# Patient Record
Sex: Male | Born: 1995 | Hispanic: No | Marital: Single | Smoking: Current every day smoker
Health system: Southern US, Academic
[De-identification: ages and names within clinical notes are randomized; demographics above are authoritative.]

## PROBLEM LIST (undated history)

## (undated) NOTE — ED Notes (Signed)
Formatting of this note might be different from the original.  Pt calm , collective , denied sob and ambulatory upon discharge. Discharged w/ support person   Electronically signed by Bennie Pierini, RN BSN at 08/27/2018 10:45 PM EDT

## (undated) NOTE — ED Notes (Signed)
Formatting of this note might be different from the original.  Pt given discharge paperwork, allowing time for questions. Pt verbalized understanding, pt ambulatory and in NAD at time of discharge.    Electronically signed by Cathleen Fears, RN at 09/17/2021  6:33 PM EDT

## (undated) NOTE — ED Provider Notes (Signed)
Formatting of this note is different from the original.  Images from the original note were not included.     [facility]     Chief Complaint   Pharyngitis (Pt reports sore throat with neck swelling and episodes of nausea/vomiting that started last night)      History of Present Illness   Terry Nicholson is a 70 y.o. male who presents to the ED with a chief complaint of sore throat.  Reports he is had this since last night.  Also had episodes of nausea and vomiting due to the sore throat.  States he is unable to tolerate fluids at this time.  Reports no fever, trismus, does have some odynophagia.  Reports no recent exposures.     Medical History   History reviewed. No pertinent past medical history.  History reviewed. No pertinent surgical history.  No family history on file.  Social History     Social History Narrative    Not on file     Social History     Tobacco Use    Smoking status: Not on file    Smokeless tobacco: Not on file   Substance Use Topics    Alcohol use: Not on file    Drug use: Not on file       Review of Systems   Review of Systems   Constitutional:  Negative for fever.   HENT:  Positive for sore throat.    Respiratory:  Negative for cough.    Gastrointestinal:  Positive for nausea and vomiting.   Musculoskeletal:  Negative for neck pain.      Physical Exam   Vitals:    09/17/21 1612 09/17/21 1616   BP: (!) 174/91    Patient Position: Sitting    Pulse: 86    Resp: 14    Temp: 36.3 C (97.4 F)    TempSrc: Temporal    SpO2: 95%    Weight:  (!) 103.8 kg (228 lb 13.4 oz)   Height: 1.829 m (6')      Physical Exam  Vitals and nursing note reviewed.   Constitutional:       Appearance: Normal appearance.   HENT:      Right Ear: Tympanic membrane normal.      Left Ear: Tympanic membrane normal.      Mouth/Throat:      Pharynx: Posterior oropharyngeal erythema present. No oropharyngeal exudate.   Cardiovascular:      Rate and Rhythm: Normal rate.   Pulmonary:      Effort:  Pulmonary effort is normal.   Musculoskeletal:      Cervical back: Tenderness present.   Lymphadenopathy:      Cervical: No cervical adenopathy.   Neurological:      Mental Status: He is alert.       Procedures   Procedures  No notes on file      Medical Decision Making   Results for orders placed or performed during the hospital encounter of 09/17/21   Streptococcus A Screen    Specimen: Throat; Swab   Result Value Ref Range    Streptococcus A Screen Negative Negative   Mononucleosis Screen    Specimen: Blood   Result Value Ref Range    Mono Screen Negative Negative   COVID19, Flu A+B and RSV    Specimen: Nasopharyngeal; Swab   Result Value Ref Range    Influenza A Negative Negative, Invalid    Influenza B Negative Negative,  Invalid    RSV PCR Negative Negative, Invalid    COVID-19, PCR Not Detected Not Detected, Invalid     No orders to display     MEDICATIONS RECEIVED IN ED:  Medications   ondansetron (ZOFRAN-ODT) disintegrating tablet 4 mg (4 mg Oral Given 09/17/21 1700)   ketorolac (TORADOL) injection 15 mg (15 mg Intramuscular Given 09/17/21 1712)     MDM:  9 year old male with sore throat for 1 day with some nausea vomiting.  Does have some tonsillar erythema but no exudate.  Does have cervical tenderness but no identifiable lymphadenopathy.  Is having nausea vomiting, patient states this is due to him having trouble swallowing.  We will check a viral panel including COVID and flu, we will check a strep and mono, given medications for nausea and do a p.o. fluid challenge.    P.o. fluid challenge was successful.  We will give a course of Zofran to use as needed for any nausea.  Point of care testing was negative.  Discussed with patient this is likely viral, can use over-the-counter medications for symptomatic relief.  Instructed any new or worsening symptoms can return to the ER.  Medical Decision Making      ED Course and Updates       Disposition and Diagnosis   Disposition  Discharged [1]    Diagnosis  1.  Acute pharyngitis, unspecified etiology        Medications  New Prescriptions    ONDANSETRON (ZOFRAN) 4 MG TABLET    Take 1 tablet by mouth every 8 hours as needed for nausea.       Follow Up  No follow-up provider specified.       Attestation   Electronically signed by Cynda Familia, NP.       Cynda Familia, NP  09/17/21 1822    Electronically signed by Fredrich Birks, MD at 09/19/2021 11:58 AM EDT

## (undated) NOTE — ED Provider Notes (Signed)
Formatting of this note is different from the original.  [facility]    ED Provider Note    Terry Nicholson 67 y.o. male DOB: 18-Jul-1995 MRN: [id number]  History     Chief Complaint   Patient presents with   ? Sore Throat     for 2 days, pt states his throat feels swollen. tested neg for Strep at [facility] today, Covid-19 test pending     History provided by:  Patient  Sore Throat   Location:  Posterior  Quality:  Sore  Severity:  Moderate  Onset quality:  Sudden  Duration:  2 days  Timing:  Constant  Progression:  Unchanged  Chronicity:  New  Relieved by:  Nothing  Worsened by:  Swallowing  Associated symptoms: no adenopathy, no chills, no cough, no ear pain, no fever, no rash, no rhinorrhea, no stridor and no voice change    Risk factors: no exposure to mono and no sick contacts      No past medical history on file.    No past surgical history on file.    Social History     Substance and Sexual Activity   Alcohol Use Not on file     Social History     Tobacco Use   Smoking Status Not on file     No existing history information found.  Social History     Substance and Sexual Activity   Drug Use Not on file           No Known Allergies    Home Medications    No medications on file     Review of Systems     Review of Systems   Constitutional: Negative for chills and fever.   HENT: Positive for sore throat. Negative for ear pain, rhinorrhea and voice change.    Eyes: Negative for redness.   Respiratory: Negative for cough and stridor.    Gastrointestinal: Negative for vomiting.   Skin: Negative for rash.   Hematological: Negative for adenopathy.     Physical Exam     ED Triage Vitals [08/27/18 2116]   BP (!) 153/83   Heart Rate 109   Resp 16   SpO2 98 %   Temp 100.1 F (37.8 C)     Physical Exam   Nursing note and vitals reviewed.  Constitutional: He appears well-developed and well-nourished.   HENT:   Mouth/Throat: Oropharyngeal exudate and posterior oropharyngeal erythema. No  peritonsillar abscesses.No uvular swelling. No muffled voice. No foreign body present.   Neck: No stridor.   Cardiovascular: Regular rhythm and normal heart sounds. Tachycardia present.   Pulmonary/Chest: Respiratory effort normal and breath sounds normal.   Lymphadenopathy:     No cervical adenopathy.   Neurological: He is alert and oriented to person, place, and time.   Skin: Skin is warm. Skin is dry.     ED Course     Lab results:    No data to display    Imaging:  No data to display  ECG:  ECG Results    None      Pre-Sedation  Procedures      MDM  Number of Diagnoses or Management Options  Patient Progress  Patient progress: stable    Coding    Provider Communication    New Prescriptions    AMOXICILLIN (AMOXIL) 500 MG CAPSULE    Take one capsule (500 mg dose) by mouth 3 (three) times a day  for 7 days.       Quantity: 21 capsule    Refills: 0     Modified Medications    No medications on file     Discontinued Medications    No medications on file     Clinical Impression     Final diagnoses:   Pharyngitis, unspecified etiology     ED Disposition     ED Disposition Comment    Discharge                Follow-up Information     [facility] Emergency Department.    Specialty:  Emergency Medicine  Comments:  As needed  Contact information:  [place]  [phone]            Electronically signed by:    Era Bumpers, MD  08/27/18 2150    Electronically signed by Era Bumpers, MD at 08/27/2018  9:50 PM EDT

## (undated) NOTE — ED Notes (Signed)
Formatting of this note might be different from the original.  Pt masked at 2056  Electronically signed by Marylene Land, RN at 08/27/2018  9:18 PM EDT

---
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
## 2016-08-22 ENCOUNTER — Emergency Department (HOSPITAL_COMMUNITY): Payer: Self-pay

## 2016-08-22 ENCOUNTER — Emergency Department (HOSPITAL_COMMUNITY)
Admission: EM | Admit: 2016-08-22 | Discharge: 2016-08-22 | Disposition: A | Discharge status: Home / Self Care | Payer: Self-pay | Attending: Emergency Medicine | Admitting: Emergency Medicine

## 2016-08-22 ENCOUNTER — Encounter (HOSPITAL_COMMUNITY): Payer: Self-pay

## 2016-08-22 DIAGNOSIS — S93402A Sprain of unspecified ligament of left ankle, initial encounter: Secondary | ICD-10-CM | POA: Insufficient documentation

## 2016-08-22 DIAGNOSIS — Y999 Unspecified external cause status: Secondary | ICD-10-CM | POA: Insufficient documentation

## 2016-08-22 DIAGNOSIS — Y939 Activity, unspecified: Secondary | ICD-10-CM | POA: Insufficient documentation

## 2016-08-22 DIAGNOSIS — S99912A Unspecified injury of left ankle, initial encounter: Secondary | ICD-10-CM

## 2016-08-22 DIAGNOSIS — M79672 Pain in left foot: Secondary | ICD-10-CM

## 2016-08-22 DIAGNOSIS — S93602A Unspecified sprain of left foot, initial encounter: Secondary | ICD-10-CM | POA: Insufficient documentation

## 2016-08-22 DIAGNOSIS — F17210 Nicotine dependence, cigarettes, uncomplicated: Secondary | ICD-10-CM | POA: Insufficient documentation

## 2016-08-22 DIAGNOSIS — Y929 Unspecified place or not applicable: Secondary | ICD-10-CM | POA: Insufficient documentation

## 2016-08-22 MED ORDER — HYDROCODONE-ACETAMINOPHEN 5-325 MG PO TABS
1.0000 | ORAL_TABLET | Freq: Once | ORAL | Status: AC
Start: 2016-08-22 — End: 2016-08-22
  Administered 2016-08-22: 1 via ORAL
  Filled 2016-08-22: qty 1

## 2016-08-22 NOTE — Discharge Instructions (Signed)
Wear ankle brace for at least 2 weeks for stabilization of ankle. Use crutches as needed for comfort. Ice and elevate ankle/foot throughout the day, using ice pack for no more than 20 minutes every hour.  Alternate between tylenol and motrin as needed for pain. Call podiatrist follow up today or tomorrow to schedule followup appointment for recheck of ongoing ankle/foot pain in 1 week. Return to the ER for changes or worsening symptoms.

## 2016-08-22 NOTE — ED Triage Notes (Addendum)
Pt c/o L ankle pain x 1 day.  Pain score 7/10.  Pt has not taken anything for pain.  Swelling noted. Pt reports he injured it while long boarding.

## 2016-08-22 NOTE — ED Provider Notes (Signed)
[facility]-EMERGENCY DEPT Provider Note   CSN: [id number] Arrival date & time: 08/22/16  1113  By signing my name below, I, Modena Jansky, attest that this documentation has been prepared under the direction and in the presence of non-physician practitioner, [address], PA-C. Electronically Signed: Modena Jansky, Scribe. 08/22/2016. 1:37 PM.  History   Chief Complaint Chief Complaint  Patient presents with  . Ankle Injury   The history is provided by the patient and medical records. No language interpreter was used.  Ankle Injury  This is a new problem. The current episode started yesterday. The problem occurs constantly. The problem has been gradually worsening. The symptoms are aggravated by walking, standing and bending. Nothing relieves the symptoms. He has tried nothing for the symptoms. The treatment provided no relief.    HPI Comments: [name] [name] is a 21 y.o. male who presents to the Emergency Department complaining of constant moderate left ankle/foot pain that started yesterday. He states he was riding his long board yesterday without a helmet when he fell on his left foot/ankle. No head injury or LOC. His pain worsened this morning so he came to the ED. He describes the pain as 7/10 constant, sharp/throbbing, L ankle pain radiating to left foot, exacerbated by weight-bearing, movement, and palpation, and with no treatments tried PTA. He reports associated swelling, bruising, and tingling in the toes. Denies numbness, focal weakness, abrasions, head inj, LOC, or any other complaints or injuries at this time. Not on blood thinners.    History reviewed. No pertinent past medical history.  There are no active problems to display for this patient.   History reviewed. No pertinent surgical history.     Home Medications    Prior to Admission medications   Not on File    Family History History reviewed. No pertinent family history.  Social History Social History    Substance Use Topics  . Smoking status: Current Some Day Smoker    Packs/day: 1.00    Types: Cigarettes  . Smokeless tobacco: Never Used  . Alcohol use 0.6 oz/week    1 Cans of beer per week     Comment: socially      Allergies   Patient has no known allergies.   Review of Systems Review of Systems  HENT: Negative for facial swelling (no head inj).   Musculoskeletal: Positive for arthralgias, joint swelling and myalgias.  Skin: Positive for color change. Negative for wound.  Allergic/Immunologic: Negative for immunocompromised state.  Neurological: Negative for syncope, weakness and numbness.       +tingling  Hematological: Does not bruise/bleed easily.  Psychiatric/Behavioral: Negative for confusion.    Physical Exam Updated Vital Signs BP (!) 141/72 (BP Location: Left Arm)   Pulse (!) 112   Temp 98.3 F (36.8 C) (Oral)   Resp 18   Ht 6\' 1"  (1.854 m)   Wt 30 lb (13.6 kg)   SpO2 100%   BMI 3.96 kg/m   Physical Exam  Constitutional: He is oriented to person, place, and time. Vital signs are normal. He appears well-developed and well-nourished.  Non-toxic appearance. No distress.  Afebrile, nontoxic, NAD  HENT:  Head: Normocephalic and atraumatic.  Mouth/Throat: Mucous membranes are normal.  Eyes: Conjunctivae and EOM are normal. Right eye exhibits no discharge. Left eye exhibits no discharge.  Neck: Normal range of motion. Neck supple.  Cardiovascular: Normal rate and intact distal pulses.   Tachycardic in triage which resolved on exam  Pulmonary/Chest: Effort normal. No respiratory distress.  Abdominal: Normal appearance. He exhibits no distension.  Musculoskeletal:       Left ankle: He exhibits decreased range of motion (due to pain), swelling and ecchymosis. He exhibits no deformity, no laceration and normal pulse. Tenderness. Lateral malleolus and medial malleolus tenderness found. Achilles tendon normal.  Left ankle with limited ROM due to pain, +swelling,  no crepitus or deformity, with moderate TTP of the lateral and medial malleoli extending into the mid foot, but no TTP or swelling of calf. No break in skin. +bruising without erythema. No warmth. Achilles intact. Good pedal pulse and cap refill of all toes. Wiggling toes without difficulty. Sensation grossly intact. Soft compartments  Neurological: He is alert and oriented to person, place, and time. He has normal strength. No sensory deficit.  Skin: Skin is warm, dry and intact. No rash noted.  Psychiatric: He has a normal mood and affect.  Nursing note and vitals reviewed.    ED Treatments / Results  DIAGNOSTIC STUDIES: Oxygen Saturation is 100% on RA, normal by my interpretation.    COORDINATION OF CARE: 1:41 PM- Pt advised of plan for treatment and pt agrees.  Labs (all labs ordered are listed, but only abnormal results are displayed) Labs Reviewed - No data to display  EKG  EKG Interpretation None       Radiology Dg Ankle Complete Left  Result Date: 08/22/2016 CLINICAL DATA:  Ankle injury.  Fall.  Pain. EXAM: LEFT ANKLE COMPLETE - 3+ VIEW COMPARISON:  None. FINDINGS: Lateral soft tissue swelling. Well corticated bone fragments adjacent to the medial and lateral malleoli, likely related to old injury. No visible acute fracture. Joint spaces are maintained. IMPRESSION: Well corticated bone fragments adjacent to both malleoli, likely related to old injury. No visible acute fracture. Electronically Signed   By: Charlett NoseKevin  Dover M.D.   On: 08/22/2016 14:01   Dg Foot Complete Left  Result Date: 08/22/2016 CLINICAL DATA:  Larey SeatFell from a long board 1 day ago, generalized LEFT foot pain proximally and in ankle EXAM: LEFT FOOT - COMPLETE 3+ VIEW COMPARISON:  None FINDINGS: Osseous mineralization normal. Joint spaces preserved. No acute fracture, dislocation, or bone destruction. Mild anterior soft tissue swelling at ankle. IMPRESSION: No acute osseous abnormalities. Electronically Signed   By:  Ulyses SouthwardMark  Boles M.D.   On: 08/22/2016 15:30    Procedures Procedures (including critical care time)  Medications Ordered in ED Medications  HYDROcodone-acetaminophen (NORCO/VICODIN) 5-325 MG per tablet 1 tablet (1 tablet Oral Given 08/22/16 1401)     Initial Impression / Assessment and Plan / ED Course  I have reviewed the triage vital signs and the nursing notes.  Pertinent labs & imaging results that were available during my care of the patient were reviewed by me and considered in my medical decision making (see chart for details).     21 y.o. male here with L ankle/foot injury while skateboarding yesterday. No head inj or LOC. On exam, achilles intact, mild swelling and bruising to ankle, moderate TTP to bimalleoli and into the mid-foot. NVI with soft compartments. Will give pain meds and obtain xray imaging of foot and ankle. Will reassess shortly  3:37 PM Xrays negative for acute fracture, demonstrate prior fx but doesn't seem like this would be from yesterday's injury given how well rounded and corticated the fragments are. Advised RICE, tylenol/motrin for pain, will apply ASO brace and give crutches for comfort, discussed f/up with podiatry in 1wk for recheck of symptoms and ongoing management of injury. Advised always  wearing a helmet. I explained the diagnosis and have given explicit precautions to return to the ER including for any other new or worsening symptoms. The patient understands and accepts the medical plan as it's been dictated and I have answered their questions. Discharge instructions concerning home care and prescriptions have been given. The patient is STABLE and is discharged to home in good condition.   I personally performed the services described in this documentation, which was scribed in my presence. The recorded information has been reviewed and is accurate.   Final Clinical Impressions(s) / ED Diagnoses   Final diagnoses:  Sprain of left ankle, unspecified  ligament, initial encounter  Injury of left ankle, initial encounter  Left foot pain  Foot sprain, left, initial encounter    New Prescriptions New Prescriptions   No medications on [address], [place], [place] 08/22/16 1537    Jacalyn Lefevre, MD 08/22/16 [phone]

## 2017-07-20 ENCOUNTER — Emergency Department (HOSPITAL_COMMUNITY): Payer: Self-pay

## 2017-07-20 ENCOUNTER — Encounter (HOSPITAL_COMMUNITY): Payer: Self-pay | Admitting: Emergency Medicine

## 2017-07-20 ENCOUNTER — Emergency Department (HOSPITAL_COMMUNITY)
Admission: EM | Admit: 2017-07-20 | Discharge: 2017-07-20 | Disposition: A | Discharge status: Home / Self Care | Payer: Self-pay | Attending: Emergency Medicine | Admitting: Emergency Medicine

## 2017-07-20 DIAGNOSIS — Y999 Unspecified external cause status: Secondary | ICD-10-CM | POA: Insufficient documentation

## 2017-07-20 DIAGNOSIS — S40011A Contusion of right shoulder, initial encounter: Secondary | ICD-10-CM | POA: Insufficient documentation

## 2017-07-20 DIAGNOSIS — F17210 Nicotine dependence, cigarettes, uncomplicated: Secondary | ICD-10-CM | POA: Insufficient documentation

## 2017-07-20 DIAGNOSIS — Y929 Unspecified place or not applicable: Secondary | ICD-10-CM | POA: Insufficient documentation

## 2017-07-20 DIAGNOSIS — Z23 Encounter for immunization: Secondary | ICD-10-CM | POA: Insufficient documentation

## 2017-07-20 DIAGNOSIS — Y9351 Activity, roller skating (inline) and skateboarding: Secondary | ICD-10-CM | POA: Insufficient documentation

## 2017-07-20 MED ORDER — TRAMADOL HCL 50 MG PO TABS: 50.0000 mg | ORAL_TABLET | Freq: Four times a day (QID) | ORAL | 0 refills | Status: AC | PRN

## 2017-07-20 MED ORDER — TETANUS-DIPHTH-ACELL PERTUSSIS 5-2.5-18.5 LF-MCG/0.5 IM SUSP
0.5000 mL | Freq: Once | INTRAMUSCULAR | Status: AC
  Administered 2017-07-20: 0.5 mL via INTRAMUSCULAR
  Filled 2017-07-20: qty 0.5

## 2017-07-20 MED ORDER — IBUPROFEN 600 MG PO TABS: 600.0000 mg | ORAL_TABLET | Freq: Four times a day (QID) | ORAL | 0 refills | Status: AC | PRN

## 2017-07-20 NOTE — ED Notes (Signed)
Patient transported to X-ray 

## 2017-07-20 NOTE — ED Triage Notes (Signed)
Pt reports he was at skateboard park and fell while skateboarding. Pt c/o right shoulder pain and has limited ROM due to pain.

## 2017-07-20 NOTE — ED Notes (Signed)
Ortho tech called 

## 2017-07-20 NOTE — ED Provider Notes (Signed)
[facility]-EMERGENCY DEPT Provider Note   CSN: [id number] Arrival date & time: 07/20/17  4098     History   Chief Complaint Chief Complaint  Patient presents with  . Shoulder Injury    HPI [name] [name] is a 22 y.o. male.  Patient is a 22 year old male who presents with right shoulder pain.  He states that he was skateboarding last night and fell onto his right shoulder.  He states he did have his arm stretched out but then fell onto the shoulder.  He complains of ongoing pain to his left shoulder.  He reports that he has had prior dislocations of the shoulder but has not actually been evaluated at healthcare facility for that.  He denies any other injuries.  He states he did not hit his head.  He denies any neck or back pain.  No numbness or weakness to the arm.     History reviewed. No pertinent past medical history.  There are no active problems to display for this patient.   History reviewed. No pertinent surgical history.      Home Medications    Prior to Admission medications   Medication Sig Start Date End Date Taking? Authorizing Provider  ibuprofen (ADVIL,MOTRIN) 600 MG tablet Take 1 tablet (600 mg total) by mouth every 6 (six) hours as needed. 07/20/17   Rolan Bucco, MD  traMADol (ULTRAM) 50 MG tablet Take 1 tablet (50 mg total) by mouth every 6 (six) hours as needed. 07/20/17   Rolan Bucco, MD    Family History No family history on file.  Social History Social History   Tobacco Use  . Smoking status: Current Some Day Smoker    Packs/day: 1.00    Types: Cigarettes  . Smokeless tobacco: Never Used  Substance Use Topics  . Alcohol use: Yes    Alcohol/week: 0.6 oz    Types: 1 Cans of beer per week    Comment: socially   . Drug use: Yes    Types: Marijuana    Comment: molly use     Allergies   Patient has no known allergies.   Review of Systems Review of Systems  Constitutional: Negative for fever.    Gastrointestinal: Negative for nausea and vomiting.  Musculoskeletal: Positive for arthralgias and joint swelling. Negative for back pain and neck pain.  Skin: Negative for wound.  Neurological: Negative for weakness, numbness and headaches.     Physical Exam Updated Vital Signs BP 131/86   Pulse 63   Temp 98.3 F (36.8 C) (Oral)   Resp 14   SpO2 96%   Physical Exam  Constitutional: He is oriented to person, place, and time. He appears well-developed and well-nourished.  HENT:  Head: Normocephalic and atraumatic.  Neck: Normal range of motion. Neck supple.  Cardiovascular: Normal rate.  Pulmonary/Chest: Effort normal.  Musculoskeletal: He exhibits edema and tenderness.  Patient has diffuse tenderness to the right shoulder.  He has pain with abduction and external rotation the shoulder.  There is no deformity noted.  There is tenderness over the [facility] joint.  There is tenderness over the radial side of the right elbow.  No swelling or effusion is noted.  He has a small abrasion to the medial aspect of the right elbow.  There is no tenderness to the wrist or hand.  He has normal sensation and motor function in the hand.  Radial pulses are intact.  No pain along the cervical thoracic or lumbosacral spine.  Neurological:  He is alert and oriented to person, place, and time.  Skin: Skin is warm and dry.  Psychiatric: He has a normal mood and affect.     ED Treatments / Results  Labs (all labs ordered are listed, but only abnormal results are displayed) Labs Reviewed - No data to display  EKG None  Radiology Dg Shoulder Right  Result Date: 07/20/2017 CLINICAL DATA:  Fall while skateboarding yesterday with right shoulder pain, initial encounter EXAM: RIGHT SHOULDER - 2+ VIEW COMPARISON:  None. FINDINGS: There is no evidence of fracture or dislocation. There is no evidence of arthropathy or other focal bone abnormality. Soft tissues are unremarkable. IMPRESSION: No acute abnormality  noted. Electronically Signed   By: Alcide Clever M.D.   On: 07/20/2017 08:56   Dg Elbow Complete Right  Result Date: 07/20/2017 CLINICAL DATA:  Fall while skateboarding yesterday with elbow pain, initial encounter EXAM: RIGHT ELBOW - COMPLETE 3+ VIEW COMPARISON:  None. FINDINGS: There is no evidence of fracture, dislocation, or joint effusion. There is no evidence of arthropathy or other focal bone abnormality. Soft tissues are unremarkable. IMPRESSION: No acute abnormality noted. Electronically Signed   By: Alcide Clever M.D.   On: 07/20/2017 08:56    Procedures Procedures (including critical care time)  Medications Ordered in ED Medications  Tdap (BOOSTRIX) injection 0.5 mL (0.5 mLs Intramuscular Given 07/20/17 2956)     Initial Impression / Assessment and Plan / ED Course  I have reviewed the triage vital signs and the nursing notes.  Pertinent labs & imaging results that were available during my care of the patient were reviewed by me and considered in my medical decision making (see chart for details).     Patient is a 22 year old male who presents with an injury to his right shoulder.  He also has some pain in his right elbow.  X-rays were reviewed and have no evidence of fracture or dislocation.  He is neurologically intact.  He was given a shoulder sling to use.  He was advised in ice and elevation.  He was given a prescription for ibuprofen and tramadol to use for symptomatic relief.  He was given a referral to follow-up with orthopedics.  Final Clinical Impressions(s) / ED Diagnoses   Final diagnoses:  Contusion of right shoulder, initial encounter    ED Discharge Orders        Ordered    traMADol (ULTRAM) 50 MG tablet  Every 6 hours PRN     07/20/17 1016    ibuprofen (ADVIL,MOTRIN) 600 MG tablet  Every 6 hours PRN     07/20/17 1016       Rolan Bucco, MD 07/20/17 1016

## 2018-01-17 ENCOUNTER — Encounter (HOSPITAL_COMMUNITY): Payer: Self-pay | Admitting: Emergency Medicine

## 2018-01-17 ENCOUNTER — Emergency Department (HOSPITAL_COMMUNITY)
Admission: EM | Admit: 2018-01-17 | Discharge: 2018-01-17 | Disposition: A | Discharge status: Home / Self Care | Payer: No Typology Code available for payment source | Attending: Emergency Medicine | Admitting: Emergency Medicine

## 2018-01-17 ENCOUNTER — Emergency Department (HOSPITAL_COMMUNITY): Payer: No Typology Code available for payment source

## 2018-01-17 ENCOUNTER — Other Ambulatory Visit: Payer: Self-pay

## 2018-01-17 DIAGNOSIS — F17210 Nicotine dependence, cigarettes, uncomplicated: Secondary | ICD-10-CM | POA: Insufficient documentation

## 2018-01-17 DIAGNOSIS — Z79899 Other long term (current) drug therapy: Secondary | ICD-10-CM | POA: Diagnosis not present

## 2018-01-17 DIAGNOSIS — M25572 Pain in left ankle and joints of left foot: Secondary | ICD-10-CM | POA: Insufficient documentation
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
## 2018-01-17 NOTE — Discharge Instructions (Signed)
Evaluated today for left ankle pain.  We have placed you in a splint and given you crutches.  If your symptoms do not resolve for 1 week I recommend follow-up with orthopedic surgery.  We have referred you to them in the front of the results of your discharge paperwork you need to call to make an appointment if you are still having symptoms.  Return to the ED for any worsening symptoms.

## 2019-02-07 ENCOUNTER — Other Ambulatory Visit: Payer: Self-pay

## 2019-02-07 DIAGNOSIS — Z20822 Contact with and (suspected) exposure to covid-19: Secondary | ICD-10-CM

## 2019-02-09 LAB — NOVEL CORONAVIRUS, NAA: SARS-CoV-2, NAA: NOT DETECTED
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
## 2022-05-18 ENCOUNTER — Other Ambulatory Visit: Payer: Self-pay

## 2022-05-18 ENCOUNTER — Emergency Department: Admission: EM | Admit: 2022-05-18 | Discharge: 2022-05-18 | Discharge status: Against Medical Advice | Payer: Self-pay

## 2022-05-18 DIAGNOSIS — Z5321 Procedure and treatment not carried out due to patient leaving prior to being seen by health care provider: Secondary | ICD-10-CM | POA: Insufficient documentation

## 2022-05-18 NOTE — ED Triage Notes (Signed)
Started with tooth pain yesterday, now reports facial and neck swelling.  Able to converse.  VS stable in triage.

## 2022-05-19 ENCOUNTER — Emergency Department
Admission: EM | Admit: 2022-05-19 | Discharge: 2022-05-19 | Disposition: A | Discharge status: Home / Self Care | Payer: Self-pay | Attending: EMERGENCY MEDICINE | Admitting: EMERGENCY MEDICINE

## 2022-05-19 ENCOUNTER — Emergency Department (HOSPITAL_COMMUNITY): Payer: Self-pay

## 2022-05-19 ENCOUNTER — Encounter (HOSPITAL_COMMUNITY): Payer: Self-pay

## 2022-05-19 ENCOUNTER — Other Ambulatory Visit: Payer: Self-pay

## 2022-05-19 DIAGNOSIS — K0889 Other specified disorders of teeth and supporting structures: Secondary | ICD-10-CM | POA: Insufficient documentation

## 2022-05-19 DIAGNOSIS — K047 Periapical abscess without sinus: Secondary | ICD-10-CM | POA: Insufficient documentation

## 2022-05-19 DIAGNOSIS — K029 Dental caries, unspecified: Secondary | ICD-10-CM | POA: Insufficient documentation

## 2022-05-19 MED ORDER — HYDROCODONE 5 MG-ACETAMINOPHEN 325 MG TABLET
2.0000 | ORAL_TABLET | ORAL | Status: AC
Start: 2022-05-19 — End: 2022-05-19
  Administered 2022-05-19: 2 via ORAL
  Filled 2022-05-19: qty 2

## 2022-05-19 MED ORDER — HYDROCODONE 5 MG-ACETAMINOPHEN 325 MG TABLET
1.0000 | ORAL_TABLET | ORAL | Status: DC
Start: 2022-05-19 — End: 2022-05-19

## 2022-05-19 MED ORDER — AMOXICILLIN 500 MG CAPSULE
500.0000 mg | ORAL_CAPSULE | Freq: Three times a day (TID) | ORAL | 0 refills | Status: AC
Start: 2022-05-19 — End: 2022-05-29

## 2022-05-19 MED ORDER — HYDROCODONE 5 MG-ACETAMINOPHEN 325 MG TABLET
1.0000 | ORAL_TABLET | Freq: Four times a day (QID) | ORAL | 0 refills | Status: DC | PRN
Start: 2022-05-19 — End: 2022-05-19

## 2022-05-19 MED ORDER — HYDROCODONE 5 MG-ACETAMINOPHEN 325 MG TABLET
1.0000 | ORAL_TABLET | Freq: Four times a day (QID) | ORAL | 0 refills | Status: AC | PRN
Start: 2022-05-19 — End: ?

## 2022-05-19 MED ORDER — AMOXICILLIN 500 MG CAPSULE
500.0000 mg | ORAL_CAPSULE | Freq: Three times a day (TID) | ORAL | 0 refills | Status: DC
Start: 2022-05-19 — End: 2022-05-19

## 2022-05-19 MED ORDER — AMOXICILLIN 500 MG TABLET
500.0000 mg | ORAL_TABLET | ORAL | Status: AC
Start: 2022-05-19 — End: 2022-05-19
  Administered 2022-05-19: 500 mg via ORAL
  Filled 2022-05-19: qty 1

## 2022-05-19 NOTE — ED Triage Notes (Signed)
Dental pain w/ facial swelling.

## 2022-05-19 NOTE — ED Provider Notes (Addendum)
[facility]  Emergency Department     HISTORY OF PRESENT ILLNESS     Date:  05/19/2022  Patient's Name:  Terry Nicholson  Date of Birth:  26-Apr-1995    Dental pain severe tooth 18.  Not associated with fever or shortness of breath.  Pain not alleviated by ibuprofen.  Patient presents with his wife.          Review of Systems     Review of Systems   All other systems reviewed and are negative.      Previous History     Past Medical History:  History reviewed. No pertinent past medical history.    Past Surgical History:  History reviewed. No pertinent surgical history.    Social History:  Social History     Tobacco Use    Smoking status: Every Day     Current packs/day: 1.00     Types: Cigarettes   Substance Use Topics    Drug use: Yes     Types: Marijuana     Social History     Substance and Sexual Activity   Drug Use Yes    Types: Marijuana       Family History:  No family history on file.    Medication History:  Current Outpatient Medications   Medication Sig    amoxicillin (AMOXIL) 500 mg Oral Capsule Take 1 Capsule (500 mg total) by mouth Three times a day for 10 days    HYDROcodone-acetaminophen (NORCO) 5-325 mg Oral Tablet Take 1 Tablet by mouth Every 6 hours as needed for Pain for up to 16 doses       Allergies:  No Known Allergies    Physical Exam     Vitals:    BP 122/86   Pulse (!) 118   Temp 37.7 C (99.9 F)   Resp 20   Wt 104 kg (230 lb)   SpO2 98%   BMI 31.19 kg/m           Physical Exam  Vitals and nursing note reviewed.   Constitutional:       General: He is in acute distress.   HENT:      Mouth/Throat:      Comments: No trismus  Normal phonation  No swelling or edema under the tongue  Uvula is midline  Severe tooth decay tooth 18.  Some gingival swelling is seen.  No accessible fluctuance.  Cardiovascular:      Rate and Rhythm: Normal rate and regular rhythm.      Pulses: Normal pulses.   Pulmonary:      Effort: Pulmonary effort is normal.      Breath sounds: Normal  breath sounds. No stridor.   Musculoskeletal:      Cervical back: Normal range of motion and neck supple. No rigidity.   Neurological:      Mental Status: He is alert.   Psychiatric:      Comments: Anxious affect         Diagnostic Studies/Treatment     Medications:  Medications Administered in the ED   amoxicillin (AMOXIL) tablet (500 mg Oral Given 05/19/22 0646)   HYDROcodone-acetaminophen (NORCO) 5-325 mg per tablet (2 Tablets Oral Given 05/19/22 0645)       Discharge Medication List as of 05/19/2022  6:43 AM        START taking these medications    Details   amoxicillin (AMOXIL) 500 mg Oral Capsule Take 1 Capsule (500 mg  total) by mouth Three times a day for 10 days, Disp-30 Capsule, R-0, E-Rx      HYDROcodone-acetaminophen (NORCO) 5-325 mg Oral Tablet Take 1 Tablet by mouth Every 6 hours as needed for Pain for up to 16 doses, Disp-16 Tablet, R-0, E-Rx             Labs:    No results found for any visits on 05/19/22.    Radiology:  None    No orders to display       ECG:  NONE      Procedure     Procedures    Course/Disposition/Plan     Course:     ED Course as of 05/19/22 0652   Thu May 19, 2022   0651 Reassessment after patient's wife came I clarified plan for her, antibiotics pain control, dentist appointment and expectation of gradual and steady improvement and return precautions to come back if swelling gets worse.   Decayed tooth 18 with a developing dental abscess but no sign of deep neck space infection for example no edema swelling or tenderness under the tongue and no trismus.  Patient counseled that he will need to see a dentist as soon as possible.  Prescription drug management patient understands to mostly use over-the-counter and Motrin pain medicine and that Norco is addictive and should be used sparingly.    Disposition:    Discharged    Condition at Disposition:   Stable      Follow up:   No follow-up provider specified.    Clinical Impression:     Clinical Impression   Toothache (Primary)        Future Appointments Scheduled in Epic:  No future appointments.

## 2022-05-19 NOTE — ED Notes (Signed)
1020 05/19/22 called emergency contact on ADT facesheet and confirmed name and DOC of pt. Informed her that Rx was resent

## 2022-05-19 NOTE — Discharge Instructions (Signed)
We will need to see a dentist as soon as possible; I think you need a tooth extraction.  In the meantime take antibiotic until completed.  Please mostly use high-dose ibuprofen and plain Tylenol for pain but occasionally you can take a narcotic pill.  Narcotics are addictive and should be used sparingly.

## 2022-05-19 NOTE — ED Notes (Addendum)
Pt called ER at 1008 on 05/19/22 asking for Rx to be resent to [facility]. This tech gave information to provider to sent Rx for amoxicillin to [facility] on [address]. Rx resent at this time 1015
